# Patient Record
Sex: Male | Born: 1985 | Race: White | Hispanic: No | Marital: Single | Smoking: Current every day smoker
Health system: Southern US, Community
[De-identification: ages and names within clinical notes are randomized; demographics above are authoritative.]

## PROBLEM LIST (undated history)

## (undated) DIAGNOSIS — M1990 Unspecified osteoarthritis, unspecified site: Secondary | ICD-10-CM

## (undated) DIAGNOSIS — R569 Unspecified convulsions: Secondary | ICD-10-CM

## (undated) HISTORY — DX: Unspecified osteoarthritis, unspecified site: M19.90

## (undated) HISTORY — PX: SHOULDER SURGERY: SHX246

---
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
## 2003-07-23 ENCOUNTER — Emergency Department (HOSPITAL_COMMUNITY): Admission: EM | Admit: 2003-07-23 | Discharge: 2003-07-23 | Payer: Self-pay | Admitting: Emergency Medicine

## 2004-01-04 ENCOUNTER — Emergency Department (HOSPITAL_COMMUNITY): Admission: EM | Admit: 2004-01-04 | Discharge: 2004-01-04 | Payer: Self-pay | Admitting: Emergency Medicine

## 2005-09-21 ENCOUNTER — Emergency Department (HOSPITAL_COMMUNITY): Admission: EM | Admit: 2005-09-21 | Discharge: 2005-09-21 | Payer: Self-pay | Admitting: Emergency Medicine

## 2006-05-04 ENCOUNTER — Emergency Department (HOSPITAL_COMMUNITY): Admission: EM | Admit: 2006-05-04 | Discharge: 2006-05-04 | Payer: Self-pay | Admitting: Emergency Medicine

## 2006-05-05 ENCOUNTER — Emergency Department (HOSPITAL_COMMUNITY): Admission: EM | Admit: 2006-05-05 | Discharge: 2006-05-05 | Payer: Self-pay | Admitting: Emergency Medicine

## 2006-05-08 ENCOUNTER — Emergency Department (HOSPITAL_COMMUNITY): Admission: EM | Admit: 2006-05-08 | Discharge: 2006-05-08 | Payer: Self-pay | Admitting: Emergency Medicine

## 2006-05-12 ENCOUNTER — Encounter (HOSPITAL_COMMUNITY): Admission: RE | Admit: 2006-05-12 | Discharge: 2006-05-19 | Payer: Self-pay | Admitting: Emergency Medicine

## 2006-07-16 ENCOUNTER — Emergency Department (HOSPITAL_COMMUNITY): Admission: EM | Admit: 2006-07-16 | Discharge: 2006-07-16 | Payer: Self-pay | Admitting: Emergency Medicine

## 2006-08-12 ENCOUNTER — Emergency Department (HOSPITAL_COMMUNITY): Admission: EM | Admit: 2006-08-12 | Discharge: 2006-08-13 | Payer: Self-pay | Admitting: Emergency Medicine

## 2006-10-18 ENCOUNTER — Emergency Department (HOSPITAL_COMMUNITY): Admission: EM | Admit: 2006-10-18 | Discharge: 2006-10-18 | Payer: Self-pay | Admitting: Emergency Medicine

## 2006-12-20 ENCOUNTER — Emergency Department (HOSPITAL_COMMUNITY): Admission: EM | Admit: 2006-12-20 | Discharge: 2006-12-20 | Payer: Self-pay | Admitting: Emergency Medicine

## 2007-02-18 ENCOUNTER — Emergency Department (HOSPITAL_COMMUNITY): Admission: EM | Admit: 2007-02-18 | Discharge: 2007-02-18 | Payer: Self-pay | Admitting: Emergency Medicine

## 2007-09-30 ENCOUNTER — Emergency Department (HOSPITAL_COMMUNITY): Admission: EM | Admit: 2007-09-30 | Discharge: 2007-09-30 | Payer: Self-pay | Admitting: Internal Medicine

## 2009-08-20 ENCOUNTER — Emergency Department (HOSPITAL_COMMUNITY): Admission: EM | Admit: 2009-08-20 | Discharge: 2009-08-20 | Payer: Self-pay | Admitting: Emergency Medicine

## 2010-02-05 ENCOUNTER — Emergency Department (HOSPITAL_COMMUNITY): Admission: EM | Admit: 2010-02-05 | Discharge: 2010-02-05 | Payer: Self-pay | Admitting: Emergency Medicine

## 2010-06-09 LAB — URINALYSIS, ROUTINE W REFLEX MICROSCOPIC
Bilirubin Urine: NEGATIVE
Ketones, ur: NEGATIVE mg/dL
Protein, ur: 30 mg/dL — AB
Urobilinogen, UA: 1 mg/dL (ref 0.0–1.0)

## 2010-06-09 LAB — URINE MICROSCOPIC-ADD ON

## 2011-01-11 LAB — I-STAT 8, (EC8 V) (CONVERTED LAB)
BUN: 11
Chloride: 104
HCT: 47
Hemoglobin: 16
Operator id: 277751
Potassium: 3.9
pCO2, Ven: 41 — ABNORMAL LOW

## 2011-01-11 LAB — DIFFERENTIAL
Eosinophils Relative: 2
Lymphocytes Relative: 37
Lymphs Abs: 3.7 — ABNORMAL HIGH
Neutro Abs: 4.8

## 2011-01-11 LAB — BASIC METABOLIC PANEL
BUN: 10
Calcium: 10
GFR calc non Af Amer: >60
Potassium: 3.9
Sodium: 140

## 2011-01-11 LAB — RAPID URINE DRUG SCREEN, HOSP PERFORMED
Amphetamines: NOT DETECTED
Barbiturates: NOT DETECTED
Opiates: NOT DETECTED

## 2011-01-11 LAB — CBC
HCT: 43.3
Hemoglobin: 14.7
Platelets: 312
WBC: 9.8

## 2012-04-17 ENCOUNTER — Encounter (HOSPITAL_COMMUNITY): Payer: Self-pay | Admitting: *Deleted

## 2012-04-17 ENCOUNTER — Emergency Department (HOSPITAL_COMMUNITY)
Admission: EM | Admit: 2012-04-17 | Discharge: 2012-04-17 | Disposition: A | Discharge status: Home / Self Care | Payer: Self-pay | Attending: Emergency Medicine | Admitting: Emergency Medicine

## 2012-04-17 DIAGNOSIS — F17200 Nicotine dependence, unspecified, uncomplicated: Secondary | ICD-10-CM | POA: Insufficient documentation

## 2012-04-17 DIAGNOSIS — Y9389 Activity, other specified: Secondary | ICD-10-CM | POA: Insufficient documentation

## 2012-04-17 DIAGNOSIS — Y929 Unspecified place or not applicable: Secondary | ICD-10-CM | POA: Insufficient documentation

## 2012-04-17 DIAGNOSIS — IMO0002 Reserved for concepts with insufficient information to code with codable children: Secondary | ICD-10-CM | POA: Insufficient documentation

## 2012-04-17 DIAGNOSIS — M545 Low back pain: Secondary | ICD-10-CM

## 2012-04-17 MED ORDER — PREDNISONE 20 MG PO TABS: ORAL_TABLET | ORAL | Status: DC

## 2012-04-17 NOTE — ED Provider Notes (Signed)
History     CSN: [id number]  Arrival date & time 04/17/12  2122   First MD Initiated Contact with Patient 04/17/12 2218      Chief Complaint  Patient presents with  . Back Pain    (Consider location/radiation/quality/duration/timing/severity/associated sxs/prior treatment) Patient is a 27 y.o. male presenting with back pain.  Back Pain    This 27 year old male has one week of low back pain which radiates down the left posterior thigh without radiation below the knee. He has no associated weakness numbness or change in bowel or bladder function. He is no fever or IV drug abuse. He is no specific trauma. Has no chest pain shortness breath abdominal pain. Has no testicular pain or dysuria. Has no treatment prior to arrival. His pain is mild to severe at times worse with certain position changes. He does not use chronic narcotic analgesics.  History reviewed. No pertinent past medical history.  History reviewed. No pertinent past surgical history.  No family history on file.  History  Substance Use Topics  . Smoking status: Current Every Day Smoker  . Smokeless tobacco: Not on file  . Alcohol Use: Yes      Review of Systems  10 Systems reviewed and are negative for acute change except as noted in the HPI.  Allergies  Review of patient's allergies indicates no known allergies.  Home Medications   Current Outpatient Rx  Name  Route  Sig  Dispense  Refill  . IBUPROFEN 200 MG PO TABS   Oral   Take 400 mg by mouth every 6 (six) hours as needed. For back pain         . PREDNISONE 20 MG PO TABS      3 tabs po day one, then 2 tabs daily x 4 days   11 tablet   0     BP 131/71  Pulse 103  Temp 98.5 F (36.9 C) (Oral)  Resp 16  SpO2 97%  Physical Exam  Nursing note and vitals reviewed. Constitutional:       Awake, alert, nontoxic appearance with baseline speech.  HENT:  Head: Atraumatic.  Eyes: Pupils are equal, round, and reactive to light. Right eye exhibits  no discharge. Left eye exhibits no discharge.  Neck: Neck supple.  Cardiovascular: Normal rate and regular rhythm.   No murmur heard. Pulmonary/Chest: Effort normal and breath sounds normal. No respiratory distress. He has no wheezes. He has no rales. He exhibits no tenderness.  Abdominal: Soft. Bowel sounds are normal. He exhibits no mass. There is no tenderness. There is no rebound and no guarding.  Genitourinary:       Testicles descended, no palpable hernias  Musculoskeletal: He exhibits no edema.       Thoracic back: He exhibits no tenderness.       Lumbar back: He exhibits no tenderness.       Bilateral lower extremities non tender without new rashes or color change, baseline ROM with intact DP pulses, CR<2 secs all digits bilaterally, sensation baseline light touch bilaterally for pt, DTR's symmetric and intact bilaterally KJ / AJ, motor symmetric bilateral 5 / 5 hip flexion, quadriceps, hamstrings, EHL, foot dorsiflexion, foot plantarflexion, gait somewhat antalgic but without apparent new ataxia. Back shows mild left sacroiliac tenderness with no midline lumbar or spinal tenderness  Neurological: He is alert.       Mental status baseline for patient.  Upper extremity motor strength and sensation intact and symmetric bilaterally.  Skin: No rash  noted.  Psychiatric: He has a normal mood and affect.    ED Course  Procedures (including critical care time)  Labs Reviewed - No data to display No results found.   1. Lumbar pain with radiation down left leg       MDM  Pt stable in ED with no significant deterioration in condition. Patient / Family / Caregiver informed of clinical course, understand medical decision-making process, and agree with plan.  I doubt any other EMC precluding discharge at this time including, but not necessarily limited to the following:SBI, cauda equina.          Hurman Horn, MD 04/24/12 [phone]

## 2012-04-17 NOTE — ED Notes (Signed)
The pt has had lower back pain since last Tuesday when he felt something pop.  His pain was gone when he carried a large hutch  Today and since then his pain came back
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
## 2018-12-28 DIAGNOSIS — R569 Unspecified convulsions: Secondary | ICD-10-CM

## 2018-12-28 HISTORY — DX: Unspecified convulsions: R56.9

## 2019-01-08 ENCOUNTER — Telehealth: Payer: Self-pay | Admitting: *Deleted

## 2019-01-08 ENCOUNTER — Encounter: Payer: Self-pay | Admitting: *Deleted

## 2019-01-08 ENCOUNTER — Ambulatory Visit: Payer: 59 | Admitting: Diagnostic Neuroimaging

## 2019-01-08 NOTE — Telephone Encounter (Signed)
Patient was no show for new patient appointment today. 

## 2019-01-09 ENCOUNTER — Encounter: Payer: Self-pay | Admitting: Diagnostic Neuroimaging

## 2023-06-29 ENCOUNTER — Ambulatory Visit (INDEPENDENT_AMBULATORY_CARE_PROVIDER_SITE_OTHER): Payer: Self-pay | Admitting: Podiatry

## 2023-06-29 DIAGNOSIS — L600 Ingrowing nail: Secondary | ICD-10-CM | POA: Diagnosis not present

## 2023-06-29 NOTE — Progress Notes (Unsigned)
    Subjective:  Patient ID: Jimmy Dean, male    DOB: 1985/10/24,  MRN: [id number]  Jimmy Dean presents to clinic today for:  Chief Complaint  Patient presents with   Ingrown Toenail    Left great toenail, is ingrown and very thick. Bilateral borders.  It is causing a lot pain. Not diabetic and no anti coag.    Patient presents with the above concern.  Notes that the lateral nail border of the left great toe is most painful.  He has minimal discomfort on the medial border.  He starts a new job in 2 weeks and wants to get this resolved before then.  No Known Allergies  Review of Systems: Negative except as noted in the HPI.  Objective:  Jimmy Dean is a pleasant 38 y.o. male in NAD. AAO x 3.  Vascular Examination: Capillary refill time is 3-5 seconds to toes bilateral. Palpable pedal pulses b/l LE. Digital hair present b/l. No pedal edema b/l. Skin temperature gradient WNL b/l. No varicosities b/l. No cyanosis or clubbing noted b/l.   Dermatological Examination: There is incurvation of the left hallux lateral nail border.  There is pain on palpation of the affected nail border.  No erythema or drainage is noted  Neurological Examination: Epicritic sensation intact  Assessment/Plan: 1. Ingrown toenail    Discussed patient's condition today.  After obtaining patient consent, the left hallux was anesthetized with a 50:50 mixture of 1% lidocaine plain and 0.5% bupivacaine plain for a total of 3cc's administered.  Upon confirmation of anesthesia, a freer elevator was utilized to free the left hallux lateral nail border from the nail bed.  The nail border was then avulsed proximal to the eponychium and removed in toto.  The area was inspected for any remaining spicules.  A chemical matrixectomy was performed with NaOH and neutralized with acetic acid solution.  Antibiotic ointment and a DSD were applied, followed by a Coban dressing.  Patient tolerated the anesthetic and  procedure well and will f/u in 2-3 weeks for recheck.  Patient given post-procedure instructions for daily 15-minute Epsom salt soaks, antibiotic ointment and daily use of Bandaids until toe starts to dry / form eschar.   Follow-up in 1 to 2 weeks for recheck on healing   Jimmy Dean Jimmy Dean, DPM, FACFAS [facility]     [address] [place], [place] [address]                Office [phone]  Fax [phone]

## 2023-06-29 NOTE — Patient Instructions (Signed)
 Place 1/4 cup of epsom salts in a quart of warm tap water.  Submerge your foot or feet in the solution and soak for 20 minutes.  This soak should be done twice a day.  Next, remove your foot or feet from solution, blot dry the affected area. Apply ointment and cover if instructed by your doctor.   IF YOUR SKIN BECOMES IRRITATED WHILE USING THESE INSTRUCTIONS, IT IS OKAY TO SWITCH TO  WHITE VINEGAR AND WATER.  As another alternative soak, you may use antibacterial soap and water.  Dry the area well after soaking.  Apply a small amount of antibiotic ointment to the area, followed by a Bandaid or light gauze dressing.  As the area starts to dry out over the next few days, you can remove the covering at bedtime to encourage the toe to dry out more.  Once it forms a dry scab, you can discontinue these instructions.  Monitor for any signs/symptoms of infection. Call the office immediately if any occur or go directly to the emergency room. Call with any questions/concerns.

## 2023-07-13 ENCOUNTER — Encounter: Admitting: Podiatry

## 2023-07-13 NOTE — Progress Notes (Signed)
Patient did not show for scheduled appointment today.
